# Patient Record
Sex: Male | Born: 1940 | Race: White | Hispanic: No | Smoking: Former smoker
Health system: Southern US, Community
[De-identification: ages and names within clinical notes are randomized; demographics above are authoritative.]

## PROBLEM LIST (undated history)

## (undated) DIAGNOSIS — H905 Unspecified sensorineural hearing loss: Secondary | ICD-10-CM

## (undated) HISTORY — PX: MOUTH SURGERY: SHX715

## (undated) HISTORY — PX: TONSILLECTOMY: SUR1361

---

## 2016-09-27 ENCOUNTER — Other Ambulatory Visit: Payer: Self-pay | Admitting: Otolaryngology

## 2016-09-27 ENCOUNTER — Ambulatory Visit
Admission: RE | Admit: 2016-09-27 | Discharge: 2016-09-27 | Disposition: A | Discharge status: Home / Self Care | Payer: Medicare HMO | Source: Ambulatory Visit | Attending: Otolaryngology | Admitting: Otolaryngology

## 2016-09-27 DIAGNOSIS — H903 Sensorineural hearing loss, bilateral: Secondary | ICD-10-CM

## 2016-11-30 ENCOUNTER — Encounter (HOSPITAL_BASED_OUTPATIENT_CLINIC_OR_DEPARTMENT_OTHER): Payer: Self-pay | Admitting: *Deleted
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
## 2016-12-05 ENCOUNTER — Encounter (HOSPITAL_BASED_OUTPATIENT_CLINIC_OR_DEPARTMENT_OTHER)
Admission: RE | Disposition: A | Discharge status: Home / Self Care | Payer: Self-pay | Source: Ambulatory Visit | Attending: Otolaryngology

## 2016-12-05 ENCOUNTER — Ambulatory Visit (HOSPITAL_BASED_OUTPATIENT_CLINIC_OR_DEPARTMENT_OTHER)
Admission: RE | Admit: 2016-12-05 | Discharge: 2016-12-06 | Disposition: A | Discharge status: Home / Self Care | Payer: Medicare HMO | Source: Ambulatory Visit | Attending: Otolaryngology | Admitting: Otolaryngology

## 2016-12-05 ENCOUNTER — Ambulatory Visit (HOSPITAL_COMMUNITY): Payer: Medicare HMO

## 2016-12-05 ENCOUNTER — Ambulatory Visit (HOSPITAL_BASED_OUTPATIENT_CLINIC_OR_DEPARTMENT_OTHER): Payer: Medicare HMO | Admitting: Anesthesiology

## 2016-12-05 ENCOUNTER — Encounter (HOSPITAL_BASED_OUTPATIENT_CLINIC_OR_DEPARTMENT_OTHER): Payer: Self-pay | Admitting: *Deleted

## 2016-12-05 DIAGNOSIS — Z87891 Personal history of nicotine dependence: Secondary | ICD-10-CM | POA: Diagnosis not present

## 2016-12-05 DIAGNOSIS — I10 Essential (primary) hypertension: Secondary | ICD-10-CM | POA: Insufficient documentation

## 2016-12-05 DIAGNOSIS — Z79899 Other long term (current) drug therapy: Secondary | ICD-10-CM | POA: Diagnosis not present

## 2016-12-05 DIAGNOSIS — Z419 Encounter for procedure for purposes other than remedying health state, unspecified: Secondary | ICD-10-CM

## 2016-12-05 DIAGNOSIS — H9313 Tinnitus, bilateral: Secondary | ICD-10-CM | POA: Insufficient documentation

## 2016-12-05 DIAGNOSIS — H903 Sensorineural hearing loss, bilateral: Secondary | ICD-10-CM | POA: Diagnosis present

## 2016-12-05 HISTORY — DX: Unspecified sensorineural hearing loss: H90.5

## 2016-12-05 HISTORY — PX: COCHLEAR IMPLANT: SHX184

## 2016-12-05 SURGERY — INSERTION, IMPLANT, COCHLEAR
Anesthesia: General | Laterality: Right

## 2016-12-05 MED ORDER — EPINEPHRINE 30 MG/30ML IJ SOLN
INTRAMUSCULAR | Status: AC
  Filled 2016-12-05: qty 1

## 2016-12-05 MED ORDER — DEXAMETHASONE SODIUM PHOSPHATE 10 MG/ML IJ SOLN
INTRAMUSCULAR | Status: AC
  Filled 2016-12-05: qty 1

## 2016-12-05 MED ORDER — SODIUM CHLORIDE 0.9 % IR SOLN
Status: DC | PRN
  Administered 2016-12-05: 09:00:00

## 2016-12-05 MED ORDER — DEXAMETHASONE SODIUM PHOSPHATE 10 MG/ML IJ SOLN
8.0000 mg | Freq: Once | INTRAMUSCULAR | Status: AC
  Administered 2016-12-05: 8 mg via INTRAVENOUS
  Filled 2016-12-05: qty 1

## 2016-12-05 MED ORDER — LIDOCAINE-EPINEPHRINE 2 %-1:100000 IJ SOLN
INTRAMUSCULAR | Status: AC
  Filled 2016-12-05: qty 6.8

## 2016-12-05 MED ORDER — SCOPOLAMINE 1 MG/3DAYS TD PT72: 1.0000 | MEDICATED_PATCH | Freq: Once | TRANSDERMAL | Status: DC

## 2016-12-05 MED ORDER — CEFAZOLIN IN D5W 1 GM/50ML IV SOLN
1.0000 g | Freq: Three times a day (TID) | INTRAVENOUS | Status: AC
  Administered 2016-12-05 – 2016-12-06 (×3): 1 g via INTRAVENOUS
  Filled 2016-12-05 (×3): qty 50

## 2016-12-05 MED ORDER — ONDANSETRON HCL 4 MG/2ML IJ SOLN
4.0000 mg | INTRAMUSCULAR | Status: AC
Start: 2016-12-05 — End: 2016-12-05
  Administered 2016-12-05 (×2): 4 mg via INTRAVENOUS

## 2016-12-05 MED ORDER — DEXAMETHASONE SODIUM PHOSPHATE 4 MG/ML IJ SOLN
10.0000 mg | INTRAMUSCULAR | Status: AC
Start: 2016-12-05 — End: 2016-12-05
  Administered 2016-12-05: 10 mg via INTRAVENOUS

## 2016-12-05 MED ORDER — CEFAZOLIN (ANCEF) 1 G IV SOLR
2.0000 g | INTRAVENOUS | Status: AC
Start: 2016-12-05 — End: 2016-12-05
  Administered 2016-12-05: 2 g

## 2016-12-05 MED ORDER — MEPERIDINE HCL 25 MG/ML IJ SOLN: 6.2500 mg | INTRAMUSCULAR | Status: DC | PRN

## 2016-12-05 MED ORDER — CEFAZOLIN SODIUM-DEXTROSE 2-4 GM/100ML-% IV SOLN
INTRAVENOUS | Status: AC
  Filled 2016-12-05: qty 100

## 2016-12-05 MED ORDER — DEXTROSE IN LACTATED RINGERS 5 % IV SOLN
INTRAVENOUS | Status: DC
  Administered 2016-12-05 – 2016-12-06 (×2): via INTRAVENOUS

## 2016-12-05 MED ORDER — SUCCINYLCHOLINE CHLORIDE 20 MG/ML IJ SOLN
INTRAMUSCULAR | Status: DC | PRN
  Administered 2016-12-05: 140 mg via INTRAVENOUS

## 2016-12-05 MED ORDER — DEXAMETHASONE SODIUM PHOSPHATE 10 MG/ML IJ SOLN
INTRAMUSCULAR | Status: DC | PRN
  Administered 2016-12-05: .5 mL

## 2016-12-05 MED ORDER — PROPOFOL 10 MG/ML IV BOLUS
INTRAVENOUS | Status: AC
  Filled 2016-12-05: qty 20

## 2016-12-05 MED ORDER — IBUPROFEN 100 MG/5ML PO SUSP: 400.0000 mg | Freq: Four times a day (QID) | ORAL | Status: DC | PRN

## 2016-12-05 MED ORDER — PROPOFOL 10 MG/ML IV BOLUS
INTRAVENOUS | Status: DC | PRN
  Administered 2016-12-05: 150 mg via INTRAVENOUS

## 2016-12-05 MED ORDER — SCOPOLAMINE 1 MG/3DAYS TD PT72
1.0000 | MEDICATED_PATCH | Freq: Once | TRANSDERMAL | Status: AC | PRN
  Administered 2016-12-05: 1 via TRANSDERMAL

## 2016-12-05 MED ORDER — BACITRACIN ZINC 500 UNIT/GM EX OINT
TOPICAL_OINTMENT | CUTANEOUS | Status: DC | PRN
  Administered 2016-12-05: 1 via TOPICAL

## 2016-12-05 MED ORDER — LIDOCAINE 2% (20 MG/ML) 5 ML SYRINGE
INTRAMUSCULAR | Status: AC
  Filled 2016-12-05: qty 5

## 2016-12-05 MED ORDER — DIAZEPAM 5 MG PO TABS: 5.0000 mg | ORAL_TABLET | Freq: Three times a day (TID) | ORAL | Status: DC | PRN

## 2016-12-05 MED ORDER — EPHEDRINE SULFATE 50 MG/ML IJ SOLN
INTRAMUSCULAR | Status: DC | PRN
  Administered 2016-12-05 (×4): 10 mg via INTRAVENOUS

## 2016-12-05 MED ORDER — BACITRACIN ZINC 500 UNIT/GM EX OINT
1.0000 "application " | TOPICAL_OINTMENT | Freq: Three times a day (TID) | CUTANEOUS | Status: DC
  Administered 2016-12-05 – 2016-12-06 (×2): 1 via TOPICAL

## 2016-12-05 MED ORDER — ONDANSETRON HCL 4 MG/2ML IJ SOLN: 4.0000 mg | INTRAMUSCULAR | Status: DC | PRN

## 2016-12-05 MED ORDER — LACTATED RINGERS IV SOLN
INTRAVENOUS | Status: DC
  Administered 2016-12-05 (×3): via INTRAVENOUS

## 2016-12-05 MED ORDER — PROMETHAZINE HCL 25 MG/ML IJ SOLN: 6.2500 mg | INTRAMUSCULAR | Status: DC | PRN

## 2016-12-05 MED ORDER — FENTANYL CITRATE (PF) 100 MCG/2ML IJ SOLN
50.0000 ug | INTRAMUSCULAR | Status: AC | PRN
  Administered 2016-12-05 (×2): 50 ug via INTRAVENOUS
  Administered 2016-12-05: 100 ug via INTRAVENOUS

## 2016-12-05 MED ORDER — METHYLENE BLUE 0.5 % INJ SOLN
INTRAVENOUS | Status: AC
  Filled 2016-12-05: qty 10

## 2016-12-05 MED ORDER — ONDANSETRON HCL 4 MG PO TABS: 4.0000 mg | ORAL_TABLET | ORAL | Status: DC | PRN

## 2016-12-05 MED ORDER — LIDOCAINE-EPINEPHRINE 2 %-1:100000 IJ SOLN
INTRAMUSCULAR | Status: DC | PRN
  Administered 2016-12-05: 9 mL

## 2016-12-05 MED ORDER — CIPROFLOXACIN-HYDROCORTISONE 0.2-1 % OT SUSP
OTIC | Status: DC | PRN
  Administered 2016-12-05: 3 [drp] via OTIC

## 2016-12-05 MED ORDER — MIDAZOLAM HCL 2 MG/2ML IJ SOLN: 0.5000 mg | Freq: Once | INTRAMUSCULAR | Status: DC | PRN

## 2016-12-05 MED ORDER — ACETAMINOPHEN-CODEINE #3 300-30 MG PO TABS
1.0000 | ORAL_TABLET | ORAL | Status: DC | PRN
  Administered 2016-12-05: 1 via ORAL
  Filled 2016-12-05: qty 1

## 2016-12-05 MED ORDER — ZOLPIDEM TARTRATE 5 MG PO TABS: 5.0000 mg | ORAL_TABLET | Freq: Every evening | ORAL | Status: DC | PRN

## 2016-12-05 MED ORDER — MIDAZOLAM HCL 2 MG/2ML IJ SOLN: 1.0000 mg | INTRAMUSCULAR | Status: DC | PRN

## 2016-12-05 MED ORDER — MORPHINE SULFATE (PF) 2 MG/ML IV SOLN: 2.0000 mg | INTRAVENOUS | Status: DC | PRN

## 2016-12-05 MED ORDER — BACITRACIN ZINC 500 UNIT/GM EX OINT
TOPICAL_OINTMENT | CUTANEOUS | Status: AC
  Filled 2016-12-05: qty 28.35

## 2016-12-05 MED ORDER — METOCLOPRAMIDE HCL 5 MG/ML IJ SOLN
INTRAMUSCULAR | Status: DC | PRN
  Administered 2016-12-05: 10 mg via INTRAVENOUS

## 2016-12-05 MED ORDER — CIPROFLOXACIN-HYDROCORTISONE 0.2-1 % OT SUSP
OTIC | Status: AC
  Filled 2016-12-05: qty 10

## 2016-12-05 MED ORDER — METHYLENE BLUE 0.5 % INJ SOLN
INTRAVENOUS | Status: DC | PRN
  Administered 2016-12-05: .3 mL via SUBMUCOSAL

## 2016-12-05 MED ORDER — LIDOCAINE HCL (CARDIAC) 20 MG/ML IV SOLN
INTRAVENOUS | Status: DC | PRN
  Administered 2016-12-05: 40 mg via INTRAVENOUS

## 2016-12-05 MED ORDER — LIDOCAINE HCL 4 % EX SOLN
CUTANEOUS | Status: DC | PRN
  Administered 2016-12-05: 3 mL via TOPICAL

## 2016-12-05 MED ORDER — FENTANYL CITRATE (PF) 100 MCG/2ML IJ SOLN
INTRAMUSCULAR | Status: AC
  Filled 2016-12-05: qty 4

## 2016-12-05 MED ORDER — ONDANSETRON HCL 4 MG/2ML IJ SOLN
INTRAMUSCULAR | Status: AC
  Filled 2016-12-05: qty 2

## 2016-12-05 MED ORDER — FENTANYL CITRATE (PF) 100 MCG/2ML IJ SOLN: 25.0000 ug | INTRAMUSCULAR | Status: DC | PRN

## 2016-12-05 MED ORDER — LIDOCAINE-EPINEPHRINE (PF) 1 %-1:200000 IJ SOLN
INTRAMUSCULAR | Status: AC
  Filled 2016-12-05: qty 30

## 2016-12-05 MED ORDER — DEXAMETHASONE SODIUM PHOSPHATE 4 MG/ML IJ SOLN
6.0000 mg | Freq: Once | INTRAMUSCULAR | Status: AC
  Administered 2016-12-05: 6 mg via INTRAVENOUS
  Filled 2016-12-05: qty 2

## 2016-12-05 MED ORDER — SUCCINYLCHOLINE CHLORIDE 200 MG/10ML IV SOSY
PREFILLED_SYRINGE | INTRAVENOUS | Status: AC
  Filled 2016-12-05: qty 10

## 2016-12-05 SURGICAL SUPPLY — 83 items
ATTRACTOMAT 16X20 MAGNETIC DRP (DRAPES) IMPLANT
BAG DECANTER FOR FLEXI CONT (MISCELLANEOUS) ×3 IMPLANT
BENZOIN TINCTURE PRP APPL 2/3 (GAUZE/BANDAGES/DRESSINGS) ×3 IMPLANT
BLADE CLIPPER SURG (BLADE) ×3 IMPLANT
BLADE NEEDLE 3 SS STRL (BLADE) ×2 IMPLANT
BLADE NEEDLE 3MM SS STRL (BLADE) ×1
CANISTER SUCT 1200ML W/VALVE (MISCELLANEOUS) ×3 IMPLANT
CLOSURE WOUND 1/2 X4 (GAUZE/BANDAGES/DRESSINGS) ×1
CORDS BIPOLAR (ELECTRODE) ×3 IMPLANT
COTTONBALL LRG STERILE PKG (GAUZE/BANDAGES/DRESSINGS) ×3 IMPLANT
COVER BACK TABLE 60X90IN (DRAPES) ×3 IMPLANT
COVER PROBE 5X48 (MISCELLANEOUS) ×2
DECANTER SPIKE VIAL GLASS SM (MISCELLANEOUS) IMPLANT
DEPRESSOR TONGUE BLADE STERILE (MISCELLANEOUS) ×3 IMPLANT
DRAIN JACKSON RD 7FR 3/32 (WOUND CARE) IMPLANT
DRAIN PENROSE 1/2X12 LTX STRL (WOUND CARE) IMPLANT
DRAIN PENROSE 1/4X12 LTX STRL (WOUND CARE) IMPLANT
DRAPE C-ARM 42X72 X-RAY (DRAPES) ×3 IMPLANT
DRAPE INCISE IOBAN 66X45 STRL (DRAPES) ×3 IMPLANT
DRAPE MICROSCOPE WILD 40.5X102 (DRAPES) ×3 IMPLANT
DRAPE SURG 17X23 STRL (DRAPES) ×3 IMPLANT
DRAPE U-SHAPE 76X120 STRL (DRAPES) ×3 IMPLANT
DRSG EMULSION OIL 3X3 NADH (GAUZE/BANDAGES/DRESSINGS) ×3 IMPLANT
DRSG GLASSCOCK MASTOID ADT (GAUZE/BANDAGES/DRESSINGS) ×3 IMPLANT
DRSG GLASSCOCK MASTOID PED (GAUZE/BANDAGES/DRESSINGS) IMPLANT
ELECT COATED BLADE 2.86 ST (ELECTRODE) ×3 IMPLANT
ELECT PAIRED SUBDERMAL (MISCELLANEOUS) ×3
ELECT REM PT RETURN 9FT ADLT (ELECTROSURGICAL) ×3
ELECTRODE PAIRED SUBDERMAL (MISCELLANEOUS) ×1 IMPLANT
ELECTRODE REM PT RTRN 9FT ADLT (ELECTROSURGICAL) ×1 IMPLANT
EVACUATOR SILICONE 100CC (DRAIN) IMPLANT
GAUZE SPONGE 4X4 12PLY STRL (GAUZE/BANDAGES/DRESSINGS) ×3 IMPLANT
GAUZE SPONGE 4X4 16PLY XRAY LF (GAUZE/BANDAGES/DRESSINGS) ×3 IMPLANT
GLOVE ECLIPSE 7.5 STRL STRAW (GLOVE) ×6 IMPLANT
GOWN STRL REUS W/ TWL LRG LVL3 (GOWN DISPOSABLE) ×2 IMPLANT
GOWN STRL REUS W/ TWL XL LVL3 (GOWN DISPOSABLE) ×2 IMPLANT
GOWN STRL REUS W/TWL LRG LVL3 (GOWN DISPOSABLE) ×4
GOWN STRL REUS W/TWL XL LVL3 (GOWN DISPOSABLE) ×4
IMPL COCH SYNC FLEX28 SGL PROC (Prosthesis and Implant ENT) ×3 IMPLANT
IV NS 500ML (IV SOLUTION) ×2
IV NS 500ML BAXH (IV SOLUTION) ×1 IMPLANT
IV NS IRRIG 3000ML ARTHROMATIC (IV SOLUTION) ×3 IMPLANT
IV SET EXT 30 76VOL 4 MALE LL (IV SETS) ×3 IMPLANT
KIT CVR 48X5XPRB PLUP LF (MISCELLANEOUS) ×1 IMPLANT
KIT SONNET/RONDO TWO PROCESSOR (MISCELLANEOUS) ×3 IMPLANT
MARKER SKIN DUAL TIP RULER LAB (MISCELLANEOUS) ×3 IMPLANT
NDL SAFETY ECLIPSE 18X1.5 (NEEDLE) ×1 IMPLANT
NEEDLE HYPO 18GX1.5 SHARP (NEEDLE) ×2
NEEDLE HYPO 22GX1.5 SAFETY (NEEDLE) ×3 IMPLANT
NEEDLE HYPO 25X1 1.5 SAFETY (NEEDLE) ×3 IMPLANT
NEEDLE SPNL 25GX3.5 QUINCKE BL (NEEDLE) ×3 IMPLANT
NS IRRIG 1000ML POUR BTL (IV SOLUTION) ×3 IMPLANT
PACK BASIN DAY SURGERY FS (CUSTOM PROCEDURE TRAY) ×3 IMPLANT
PACK ENT DAY SURGERY (CUSTOM PROCEDURE TRAY) ×3 IMPLANT
PATTIES SURGICAL .25X.25 (GAUZE/BANDAGES/DRESSINGS) ×3 IMPLANT
PENCIL BUTTON HOLSTER BLD 10FT (ELECTRODE) ×3 IMPLANT
PIN SAFETY STERILE (MISCELLANEOUS) IMPLANT
PROBE NERVBE PRASS .33 (MISCELLANEOUS) ×3 IMPLANT
SET IRRIG Y TYPE TUR BLADDER L (SET/KITS/TRAYS/PACK) ×3 IMPLANT
SLEEVE SCD COMPRESS KNEE MED (MISCELLANEOUS) ×3 IMPLANT
SLEEVE SURGEON STRL (DRAPES) ×3 IMPLANT
SPONGE GAUZE 4X4 12PLY STER LF (GAUZE/BANDAGES/DRESSINGS) IMPLANT
SPONGE NEURO XRAY DETECT 1X3 (DISPOSABLE) ×3 IMPLANT
SPONGE SURGIFOAM ABS GEL 100 (HEMOSTASIS) ×3 IMPLANT
SPONGE SURGIFOAM ABS GEL 12-7 (HEMOSTASIS) ×3 IMPLANT
STAPLER VISISTAT (STAPLE) ×3 IMPLANT
STRIP CLOSURE SKIN 1/2X4 (GAUZE/BANDAGES/DRESSINGS) ×2 IMPLANT
SUT BONE WAX W31G (SUTURE) ×3 IMPLANT
SUT CHROMIC 3 0 PS 2 (SUTURE) ×3 IMPLANT
SUT CHROMIC 3 0 SH 27 (SUTURE) ×6 IMPLANT
SUT ETHILON 5 0 PS 2 18 (SUTURE) IMPLANT
SUT NOVAFIL 5 0 BLK 18 IN P13 (SUTURE) IMPLANT
SUT SILK 3 0 PS 1 (SUTURE) IMPLANT
SUT SILK 3 0 SH 30 (SUTURE) ×3 IMPLANT
SUT SILK 4 0 P 3 (SUTURE) ×3 IMPLANT
SYR BULB 3OZ (MISCELLANEOUS) ×3 IMPLANT
SYR TB 1ML LL NO SAFETY (SYRINGE) ×3 IMPLANT
TOWEL OR 17X24 6PK STRL BLUE (TOWEL DISPOSABLE) ×3 IMPLANT
TOWEL OR NON WOVEN STRL DISP B (DISPOSABLE) ×3 IMPLANT
TRAY DSU PREP LF (CUSTOM PROCEDURE TRAY) ×3 IMPLANT
TRAY FOLEY BAG SILVER LF 14FR (SET/KITS/TRAYS/PACK) IMPLANT
TRAY FOLEY BAG SILVER LF 16FR (SET/KITS/TRAYS/PACK) ×3 IMPLANT
TUBING IRRIGATION (MISCELLANEOUS) ×3 IMPLANT

## 2016-12-05 NOTE — Progress Notes (Signed)
S: Doing well, no complaints of nausea, vomiting or vertigo. Voiding without difficulty. Dressing intact. Drinking well.  O: Awake and alert, VS stable, VIIth intact, no nystagmus, dressing stable  A: 1. Stable postop course with no nausea or vomiting. 2. Drinking well. 3. Findings reviewed with patient  P: 1. I spoke to his wife, Larene BeachVickie in early afternoon and reported findings 2. Stable postop course. 3. Decrease IVF rate to 6350ml/hr. 4. Plan DC tomorrow if stable overnight.

## 2016-12-05 NOTE — Brief Op Note (Signed)
12/05/2016  12:38 PM  PATIENT:  Justin Schroeder  76 y.o. male  PRE-OPERATIVE DIAGNOSIS:  SEVERE TO PROFOUND SENSORINEURAL HEARING LOSS AD>AS, BEYOND HEARING AIDS  POST-OPERATIVE DIAGNOSIS:  SEVERE TO PROFOUND SENSORINEURAL HEARING LOSS AD>AS, BEYOND HEARING AIDS  PROCEDURE:  Procedure(s): COCHLEAR IMPLANT RIGHT EAR (Right) - Right MedEL Pin + Flex28 electrode, serial number [id number]  SURGEON:  Surgeon(s) and Role:    * Ermalinda BarriosEric Saylee Sherrill, MD - Primary  PHYSICIAN ASSISTANT:   ASSISTANTS: none   ANESTHESIA:   general  EBL:  Total I/O In: 2960 [P.O.:960; I.V.:2000] Out: 375 [Urine:350; Blood:25]  BLOOD ADMINISTERED:none  DRAINS: none   LOCAL MEDICATIONS USED:  XYLOCAINE   SPECIMEN:  No Specimen  DISPOSITION OF SPECIMEN:  N/A  COUNTS:  YES  TOURNIQUET:  * No tourniquets in log *  DICTATION: .Other Dictation: Dictation Number [id number]  PLAN OF CARE: Admit for overnight observation  PATIENT DISPOSITION:  PACU - hemodynamically stable.   Delay start of Pharmacological VTE agent (>24hrs) due to surgical blood loss or risk of bleeding: not applicable

## 2016-12-05 NOTE — Anesthesia Postprocedure Evaluation (Signed)
Anesthesia Post Note  Patient: Justin Schroeder  Procedure(s) Performed: Procedure(s) (LRB): COCHLEAR IMPLANT RIGHT EAR (Right)  Patient location during evaluation: PACU Anesthesia Type: General Level of consciousness: awake and alert, oriented and patient cooperative Pain management: pain level controlled Vital Signs Assessment: post-procedure vital signs reviewed and stable Respiratory status: spontaneous breathing, nonlabored ventilation, respiratory function stable and patient connected to nasal cannula oxygen Cardiovascular status: blood pressure returned to baseline and stable Postop Assessment: no signs of nausea or vomiting Anesthetic complications: no       Last Vitals:  Vitals:   12/05/16 1230 12/05/16 1245  BP: (!) 154/90 (!) 165/85  Pulse: 85 78  Resp: 16 18  Temp:  36.6 C    Last Pain:  Vitals:   12/05/16 1245  TempSrc:   PainSc: 3                  Cierrah Dace,E. Serjio Deupree

## 2016-12-05 NOTE — Transfer of Care (Signed)
Immediate Anesthesia Transfer of Care Note  Patient: Justin CoonsJerry Schroeder  Procedure(s) Performed: Procedure(s): COCHLEAR IMPLANT RIGHT EAR (Right)  Patient Location: PACU  Anesthesia Type:General  Level of Consciousness: sedated  Airway & Oxygen Therapy: Patient Spontanous Breathing and Patient connected to face mask oxygen  Post-op Assessment: Report given to RN and Post -op Vital signs reviewed and stable  Post vital signs: Reviewed and stable  Last Vitals:  Vitals:   12/05/16 0704 12/05/16 1155  BP: (!) 158/87   Pulse: 62   Resp: 16   Temp: 36.8 C (P) 36.4 C    Last Pain:  Vitals:   12/05/16 1155  TempSrc:   PainSc: (P) 0-No pain         Complications: No apparent anesthesia complications

## 2016-12-05 NOTE — Anesthesia Preprocedure Evaluation (Signed)
Anesthesia Evaluation  Patient identified by MRN, date of birth, ID band Patient awake    Reviewed: Allergy & Precautions, NPO status , Patient's Chart, lab work & pertinent test results  History of Anesthesia Complications Negative for: history of anesthetic complications  Airway Mallampati: II  TM Distance: >3 FB Neck ROM: Full    Dental  (+) Caps, Dental Advisory Given   Pulmonary former smoker,    breath sounds clear to auscultation       Cardiovascular negative cardio ROS   Rhythm:Regular Rate:Normal     Neuro/Psych sensori-neural hearing deficit    GI/Hepatic negative GI ROS, Neg liver ROS,   Endo/Other  negative endocrine ROS  Renal/GU negative Renal ROS     Musculoskeletal   Abdominal   Peds  Hematology negative hematology ROS (+)   Anesthesia Other Findings   Reproductive/Obstetrics                             Anesthesia Physical Anesthesia Plan  ASA: II  Anesthesia Plan: General   Post-op Pain Management:    Induction: Intravenous  Airway Management Planned: Oral ETT  Additional Equipment:   Intra-op Plan:   Post-operative Plan: Extubation in OR  Informed Consent: I have reviewed the patients History and Physical, chart, labs and discussed the procedure including the risks, benefits and alternatives for the proposed anesthesia with the patient or authorized representative who has indicated his/her understanding and acceptance.   Dental advisory given  Plan Discussed with: CRNA and Surgeon  Anesthesia Plan Comments: (Plan routine monitors, GETA)        Anesthesia Quick Evaluation

## 2016-12-05 NOTE — Interval H&P Note (Signed)
History and Physical Interval Note:  12/05/2016 8:23 AM  Justin Schroeder  has presented today for surgery, with the diagnosis of SEVERE TO PROFOUND SENSORINEURAL HEARING LOSS.  The various methods of treatment have been discussed with the patient. After consideration of risks, benefits and other options for treatment, the patient has consented to  Procedure(s): COCHLEAR IMPLANT RIGHT EAR (Right) - MedEL Flex 28 CI as a surgical intervention .  The patient's history has been reviewed, patient examined, no change in status, stable for surgery.  I have reviewed the patient's chart and labs.  Questions were answered to the patient's satisfaction.  The patient does not report any fever, cough or upper/lower respiratory tract infection during the last 3 days.   Dorma RussellKRAUS, Katianne Barre M

## 2016-12-05 NOTE — Anesthesia Procedure Notes (Signed)
Procedure Name: Intubation Date/Time: 12/05/2016 8:44 AM Performed by: Maryella Shivers Pre-anesthesia Checklist: Patient identified, Emergency Drugs available, Suction available and Patient being monitored Patient Re-evaluated:Patient Re-evaluated prior to inductionOxygen Delivery Method: Circle system utilized Preoxygenation: Pre-oxygenation with 100% oxygen Intubation Type: IV induction Ventilation: Mask ventilation without difficulty Laryngoscope Size: Mac and 4 Tube type: Oral Tube size: 8.0 mm Number of attempts: 1 Airway Equipment and Method: Stylet,  Oral airway and LTA kit utilized Placement Confirmation: ETT inserted through vocal cords under direct vision,  positive ETCO2 and breath sounds checked- equal and bilateral Secured at: 22 cm Tube secured with: Tape Dental Injury: Teeth and Oropharynx as per pre-operative assessment

## 2016-12-06 DIAGNOSIS — H903 Sensorineural hearing loss, bilateral: Secondary | ICD-10-CM | POA: Diagnosis not present

## 2016-12-06 NOTE — Discharge Summary (Signed)
Physician Discharge Summary  Patient ID: Rockne CoonsJerry Pola MRN: [id number] DOB/AGE: 74/03/1941 74 y.o.  Admit date: 12/05/2016 Discharge date: 12/06/2016  Admission Diagnoses: Severe to profound bilateral sensorineural hearing loss beyond hearing aids  Discharge Diagnoses: Severe to profound bilateral sensorineural hearing loss beyond hearing aids Active Problems:   Sensorineural hearing loss (SNHL), bilateral  Operation: Right MedEL cochlear implant, Mi1200 Pin + Flex28 electrode  Complications None  Discharged Condition: stable  Hospital Course:  Admitted to [facility] through preop, underwent uncomplicated right cochlear implant. Intraoperative Carm x-ray showed proper position of implant within the right cochlea. Intraoperative impedence testing of device was within normal limits except for electrode #12. Recovered in PACU without complication. Overnight stay in RCC was uneventful. No nausea, vomiting, or troubles voiding. Drinking, eating, and ambulating. DC'ed in stable condition on 12-06-16.  Consults: None  Significant Diagnostic Studies: Intraoperative C-arm xray, intraoperative impedence testing  Treatments: surgery: Right MedEL cochlear implant, Mi1200 Pin + Flex 28 electrode  Discharge Exam: Blood pressure 133/65, pulse 64, temperature 98 F (36.7 C), resp. rate 16, height 6' (1.829 m), weight 99.3 kg (219 lb), SpO2 94 %. VIIth intact, no nystagmus, incision dry and stable  Disposition: Final discharge disposition not confirmed  Diet Regular  Room Locations  [facility] preop #8, OR #2, PACU, RCC #3  Activity Quiet indoor x 1 wk.  1. DC this morning with family 2. Return to office in 1 wk for staple removal and follow up 3. Regular diet 4. Avoid laying on R side x 1 wk., avoid heavy lifting, blow nose with mouth open 5. Quiet indoor activity x 1 wk. 6. DC meds: Cefzil 500 mg PO BId x 1 wk, vicodin 5/300 1-2 PO Q4hrs. Prn pain, valiium 5 mg PO Q8hr. Prn vertigo, mupirocin ointment BID  to incision 7. DC status stable    Signed: Elide Stalzer M 12/06/2016, 6:40 AM

## 2016-12-06 NOTE — Discharge Instructions (Signed)
1. DC this morning with family 2. Return to office in 1 wk for staple removal and follow up 3. Regular diet 4. Avoid laying on R side x 1 wk., avoid heavy lifting, blow nose with mouth open 5. Quiet indoor activity x 1 wk. 6. DC meds: Cefzil 500 mg PO BId x 1 wk, vicodin 5/300 1-2 PO Q4hrs. Prn pain, valiium 5 mg PO Q8hr. Prn vertigo, mupirocin ointment BID to incision 7. DC status stable
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
## 2016-12-06 NOTE — Progress Notes (Signed)
S: Quiet night without pain, nausea, vertigo or vomiting, ambulating, voiding without difficulty  O: Awake and alert, VS stable, VIIth n function intact, no nystagmus, incision stable and without hematoma, no bleeding, flap 100% viable  A: 1. Stable postop course  P: 1. DC this morning with family 2. Return to office in 1 wk for staple removal and follow up 3. Regular diet 4. Avoid laying on R side x 1 wk., avoid heavy lifting, blow nose with mouth open 5. Quiet indoor activity x 1 wk. 6. DC meds: Cefzil 500 mg PO BId x 1 wk, vicodin 5/300 1-2 PO Q4hrs. Prn pain, valiium 5 mg PO Q8hr. Prn vertigo, mupirocin ointment BID to incision 7. DC status stable

## 2016-12-09 ENCOUNTER — Encounter (HOSPITAL_BASED_OUTPATIENT_CLINIC_OR_DEPARTMENT_OTHER): Payer: Self-pay | Admitting: Otolaryngology

## 2017-07-17 IMAGING — RF DG SKULL 1-3V
1 series · 1 of 1 positions shown · non-contrast
Comparison: CT temporal bone of 09/27/2016

CLINICAL DATA: Implantation of right cochlear implant

EXAM:
SKULL - 1-3 VIEW

[Series 1: run · 1 of 1 slices shown]
[im 1/1]
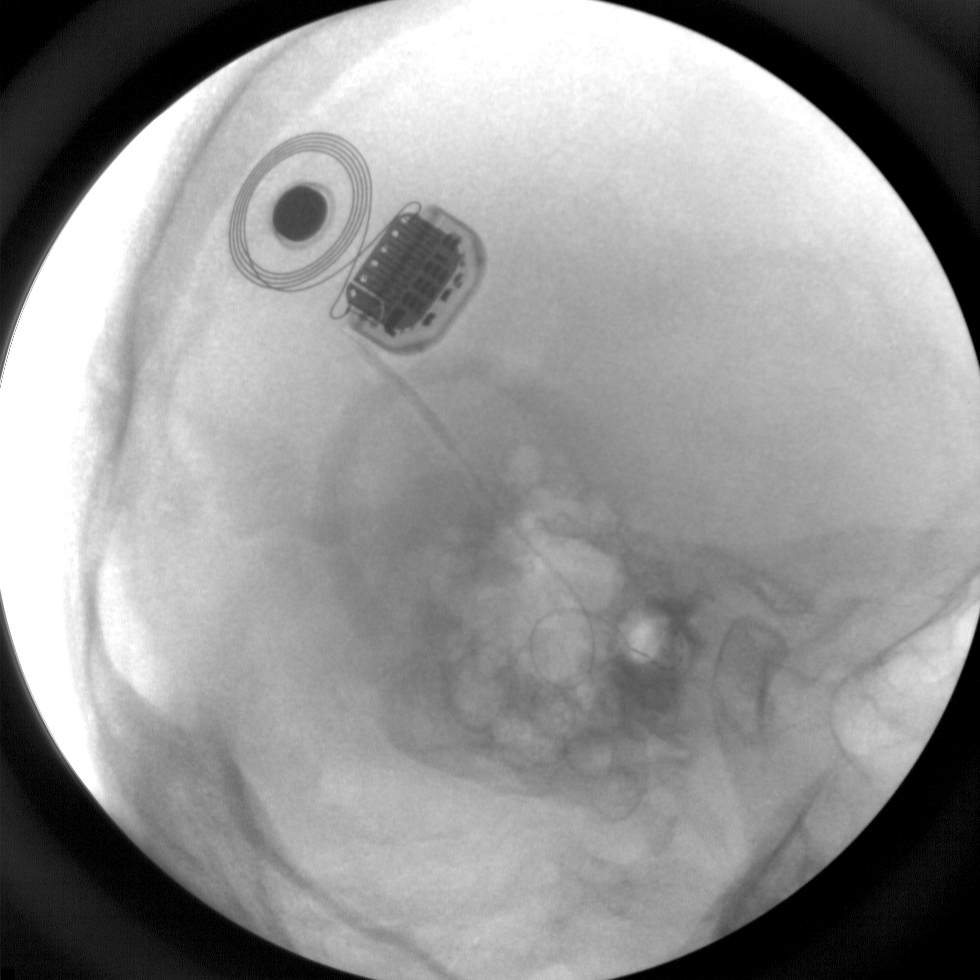

[1 of 1 positions shown; findings below may reference images not displayed]

FINDINGS: A single C-arm spot film shows implantation of right cochlear
implant. No complicating features are seen.
IMPRESSION: Implantation of right cochlear implant..

## 2024-05-01 DEATH — deceased
